# Patient Record
Sex: Female | Born: 1935 | Race: White | Hispanic: No
Health system: Southern US, Community
[De-identification: ages and names within clinical notes are randomized; demographics above are authoritative.]

---

## 2005-10-02 ENCOUNTER — Ambulatory Visit: Payer: Self-pay | Admitting: Family Medicine

## 2005-10-15 ENCOUNTER — Ambulatory Visit: Payer: Self-pay | Admitting: Family Medicine

## 2005-10-30 ENCOUNTER — Ambulatory Visit: Payer: Self-pay | Admitting: Surgery

## 2005-11-08 ENCOUNTER — Ambulatory Visit: Payer: Self-pay | Admitting: Surgery

## 2006-04-24 ENCOUNTER — Ambulatory Visit: Payer: Self-pay | Admitting: Surgery

## 2006-07-03 ENCOUNTER — Ambulatory Visit: Payer: Self-pay | Admitting: Family Medicine

## 2006-09-10 ENCOUNTER — Ambulatory Visit: Payer: Self-pay | Admitting: Family Medicine

## 2008-04-15 ENCOUNTER — Ambulatory Visit: Payer: Self-pay | Admitting: Family Medicine

## 2008-05-13 ENCOUNTER — Ambulatory Visit: Payer: Self-pay | Admitting: Family Medicine

## 2008-10-11 ENCOUNTER — Ambulatory Visit: Payer: Self-pay | Admitting: Family Medicine

## 2008-10-13 ENCOUNTER — Emergency Department: Payer: Self-pay | Admitting: Emergency Medicine

## 2009-02-13 ENCOUNTER — Emergency Department: Payer: Self-pay | Admitting: Internal Medicine

## 2009-04-15 ENCOUNTER — Ambulatory Visit: Payer: Self-pay | Admitting: Family Medicine

## 2009-12-21 ENCOUNTER — Ambulatory Visit: Payer: Self-pay | Admitting: Family Medicine

## 2013-09-24 ENCOUNTER — Emergency Department: Payer: Self-pay | Admitting: Emergency Medicine

## 2014-08-23 ENCOUNTER — Inpatient Hospital Stay: Payer: Self-pay | Admitting: Internal Medicine

## 2014-10-25 LAB — SURGICAL PATHOLOGY

## 2014-10-31 NOTE — Consult Note (Signed)
PATIENT NAME:  Michele Decker, WOODSIDE MR#:  [id number] DATE OF BIRTH:  [birth year]  DATE OF CONSULTATION:  08/23/2014  REFERRING PHYSICIAN:   CONSULTING PHYSICIAN:  Maryagnes Amos, MD  HISTORY OF PRESENT ILLNESS:  I have been asked by Dr. Hilton Sinclair to evaluate this pleasant and unfortunate woman for right hip and right wrist injuries. She is a 81 year old female with a history of dementia who lives in a nursing home. She apparently is quite ambulatory and enjoys walking. She was in her usual state of health until earlier this morning when she apparently lost her balance and fell onto her right side, injuring her right hip and her right wrist. She apparently did not strike her head and there is no documentation of any loss of consciousness. No other injuries are noted. She was brought to the Emergency Room where x-rays demonstrated a varus fracture of the right femoral neck. X-rays also demonstrated an essentially nondisplaced fracture of the right distal radius. She was placed into a sugar tong splint for her right wrist fracture by the Emergency Room staff.   PAST MEDICAL HISTORY:  As noted above. She also has a history of hypertension and hypothyroidism. She is status post a right total knee arthroplasty as well as a surgical procedure on her lumbar spine.   PHYSICAL EXAMINATION:  GENERAL:  We have an elderly female in some distress. She is responsive but confused. She is attended by her daughter and son, as well as by her daughter's husband. They have provided all of the history. VITAL SIGNS:  Stable and she is afebrile.  ORTHOPEDIC EXAMINATION: Limited to the right upper extremity and hand. The right forearm is in a splint. She is able to flex and extend all digits actively and has intact sensation to light touch to all distributions as evidenced by some withdrawal to the touch. She has good capillary refill to all digits.   Examination of her right hip and lower extremity demonstrates the skin around the  hip to be intact. There is no swelling, ecchymosis, erythema, or abrasions or lacerations. Her right lower extremity is somewhat shortened and externally rotated as compared to the left. She has pain with any attempted motion, either actively or passively, of the right hip. She again can dorsiflex and plantarflex her toes and ankle. Sensation appears to be intact to her right foot, again as evidenced by some withdrawal activity. She has good capillary refill to her foot.   X-RAYS:  X-rays of the pelvis and right hip are available for review, as are x-rays of the right wrist. The findings on these films are as described above.   IMPRESSION:  1.  Varus angulated right femoral neck fracture.  2.  Minimally impacted right distal radius fracture.   PLAN: The treatment options for each problem were discussed with the patient's family. Regarding her wrist, no additional treatment needs to be performed at this time. Based on the amount of displacement, I felt that this wrist fracture can be managed nonsurgically. She will remain in her splint at this time. Once swelling goes down, we will apply a regular short arm cast.   Regarding her right hip injury. I feel that she would be best managed by a hip hemiarthroplasty. This procedure has been discussed in detail with the patient's family, as have the potential risks (including bleeding, infection, nerve and/or blood vessel injury, persistent or recurrent pain, loosening of and/or failure of the components, dislocation, leg length inequality, need for further surgery, blood  clots, strokes, heart attacks and/or arrhythmias, etc.) and benefits. The patient's family state their understanding and wish to proceed.    I thank you for asking me to participate in the care of this most unfortunate woman. I will be happy to follow her with you.     ____________________________ J. Derald MacleodJeffrey Poggi, MD jjp:bu D: 08/23/2014 17:17:33 ET T: 08/23/2014 17:43:46  ET JOB#: [id number]  cc: Maryagnes AmosJ. Jeffrey Poggi, MD, <Dictator> Maryagnes AmosJ. JEFFREY POGGI MD ELECTRONICALLY SIGNED 08/24/2014 10:51

## 2014-10-31 NOTE — Op Note (Signed)
PATIENT NAME:  Michele Decker, Michele Decker MR#:  [id number] DATE OF BIRTH:  1933/10/05  DATE OF PROCEDURE:  08/23/2014  PREOPERATIVE DIAGNOSIS: Displaced right femoral neck fracture.   POSTOPERATIVE DIAGNOSIS: Displaced right femoral neck fracture with right greater trochanteric fracture, right hip.   PROCEDURES:  1. Right hip hemiarthroplasty using a cemented Biomet Echo fracture stem (#13) with a 49 mm outer diameter unipolar head and a -3 mm neck adapter. 2. Open reduction and internal fixation of a right greater trochanteric fracture using two 1.8 mm Dall-Miles cables.   SURGEON: Maryagnes Amos, M.D.   ANESTHESIA: Spinal.   FINDINGS: As noted above.   COMPLICATIONS: None.   ESTIMATED BLOOD LOSS: 700 mL.   TOTAL FLUIDS: 1400 mL of crystalloid.   URINE OUTPUT: 145 mL.   TOURNIQUET: None.   DRAINS: None.   CLOSURE: Michele Decker.   BRIEF CLINICAL NOTE: The patient is a 81 year old demented female who lives in a nursing home. Apparently, she fell while walking this morning, landing on her right side. She complained of right hip pain. She was brought to the Emergency Room where x-rays demonstrated a varus angulated right femoral neck fracture. X-rays also demonstrated an essentially nondisplaced right distal radius fracture. She was admitted to the medicine service and has been cleared medically. She presents, at this time, for definitive management of her right hip injury.   PROCEDURE: The patient was brought into the operating room. After adequate spinal anesthesia was obtained, she was lain in the left lateral decubitus position and secured using the lateral hip positioner. The right hip and lower extremity were prepped with ChloraPrep solution before being draped sterilely. Preoperative antibiotics were administered.   A standard posterior approach to the hip was made through an approximately 5 to 6 inch incision. The incision was carried down through the subcutaneous tissues to expose the  gluteal fascia and proximal end of the iliotibial band. These structures were split the length of the incision. Upon splitting these structures, a fracture hematoma was identified. Close inspection demonstrated that there was an essentially nondisplaced fracture of the posterior half of the greater trochanter. This piece was somewhat stable due to the fascial attachments, so care was taken to try to maintain its integrity throughout the procedure. A posterior flap was elevated off the posterior aspect of the greater trochanter and femoral neck and swept posteriorly. This flap included the piriformis tendon, the short external rotators, and the posterior capsule. Hemostasis was achieved using electrocautery. The femoral neck fracture was readily identified. The hip was gently internally rotated in a dislocation maneuver, effectively separating the shaft from the head. The head was carefully removed using a hip skid and corkscrew. The femoral head was carefully taken to the back table, where it was measured and found to be optimally replicated by a 49 mm component. The 49 mm trial was then inserted and found to fit quite well, demonstrating a suction-type fit.   Attention was directed to the femoral side. The canal was reamed sequentially, beginning with the end-cutting reamer, then progressing from a 7 mm tapered reamer to a 15 mm tapered reamer, which provided excellent circumferential chatter. The canal was then broached beginning with a #9 broach, progressing to a #14 broach. This actually fit quite well. When an attempt was made to impact the #15 broach, it was felt to be too tight. Therefore, it was felt best to implant a #13 cemented stem. The #14 stem was repositioned and a trial reduction performed. The hip demonstrated  good stability with extension and external rotation, as well as with flexion at 90 degrees and internal rotation beyond 60 degrees. It also was stable to the position of sleep. This  reduction was performed using the standard offset neck and the -6 mm neck option. Therefore, the permanent #13 standard offset stem was selected.   The canal was prepared for cementing by using a bottle brush, then irrigating it thoroughly with sterile saline solution via the jet lavage system. The cement restrictor was placed distally. After care was taken to be sure that the stem would seat fully, the canal was packed with a Neo-Synephrine soaked vag-pack. Meanwhile, cement was mixed on the back table. When the cement was ready, the cement was injected into the femoral stem and pressurized. The stem was inserted with care taken to maintain the appropriate anteversion. The stem was then held in place until the cement hardened. A repeat trial reduction was performed using the -6 mm and -3 mm neck options. The -3 mm neck option demonstrated excellent stability, both to extension and external rotation, as well as with flexion to 90 degrees and internal rotation beyond 70 degrees. There was no undue tension anteriorly. Therefore, the permanent 49 mm outer diameter shell with the -3 mm neck adapter was put together on the back table, then inserted. The Morse taper locking mechanism was verified using manual distraction and found to be excellent. The hip was relocated and again placed through a range of motion with the findings as described above.   The greater trochanteric fracture was then addressed. Given that the fracture was primarily vertically oriented and only involved the posterior half of the greater trochanter, it was felt best to proceed with stabilizing it using 2 Dall-Miles cables. Each of the 1.8 mm cables was passed circumferentially, anteriorly to posteriorly, and cinched securely with the tensioning system. Care was taken not to over tension and therefore, crack through the soft bone. An excellent reduction and good stability was achieved.  The wound was copiously irrigated with sterile saline  solution using the jet lavage system before the posterior flap was reapproximated to the posterior aspect of the greater trochanter through bone tunnels using #2 FiberWire. The iliotibial band was reapproximated using #0 Vicryl interrupted sutures before the gluteal fascia was closed using running #0 Vicryl suture. The subcutaneous tissues were closed in three layers using 2-0 Vicryl interrupted sutures before the skin was closed using Michele Decker. Of note, 20 mL of Exparel diluted out to 60 mL with normal saline was injected into the periarticular and peri-incisional tissues to help with postoperative analgesia. This was injected prior to closure of the iliotibial band. After closure of the iliotibial band, a total of 1 gram of tranexamic acid and 10 mL of normal saline was injected interarticularly to help with postoperative bleeding. After closure of the skin with Michele Decker, a sterile occlusive dressing was applied to the wound before the patient was placed into an abduction wedge pillow. She was then rolled back in the supine position on her hospital bed before she was awakened, extubated, and returned to the recovery room in satisfactory condition after tolerating the procedure well.    ____________________________ J. Derald MacleodJeffrey Poggi, MD jjp:JT D: 08/23/2014 20:39:33 ET T: 08/24/2014 08:50:42 ET JOB#: [id number]  cc: Maryagnes AmosJ. Jeffrey Poggi, MD, <Dictator>  Maryagnes AmosJ. JEFFREY POGGI MD ELECTRONICALLY SIGNED 08/24/2014 10:53

## 2014-10-31 NOTE — Discharge Summary (Signed)
PATIENT NAME:  Michele Decker, Michele Decker MR#:  Decker DATE OF BIRTH:  [birth year]  DATE OF ADMISSION:  08/23/2014 DATE OF DISCHARGE:  08/26/2014  PRESENTING COMPLAINT: Fall and hip pain.   DISCHARGE DIAGNOSES:  1.  Right femur fracture status post right hip hemiarthroplasty. Postoperative day 3 today.  2.  Atrial fibrillation, stable.  3.  Hypothyroidism.  4.  Hypertension.  5.  Dementia.  6.  Right distal radial wrist fracture, nondisplaced.  CODE STATUS: No code, DNR.   DIET: Regular.   FOLLOWUP: 1.  Follow up with Dr. Joice LoftsPoggi in 2 weeks.  2.  Follow up with Dr. Elizabeth Sauereanna Jones in 2 to 3 weeks.  CONSULTATIONS: Orthopedic consultation with Dr. Joice LoftsPoggi.  SURGERY PROCEDURES: Right hip hemiarthroplasty with open reduction and internal fixation of trochanteric fracture.   LABORATORY DATA: At discharge: H and H of 7.8 and 24.0, white count is 8.1, platelet count is 99,000. Creatinine is 1.53. Glucose is 132, sodium is 146, potassium is 4.3. UA negative for UTI.  BRIEF SUMMARY OF HOSPITAL COURSE: [name] is a 81 year old Caucasian female with a history of dementia along with a history of hypertension who comes to the Emergency Room after she had a fall and was hurting the right hip. She was admitted with:  1.  Right hip fracture. The patient underwent a right hip hemiarthroplasty with ORIF of the intertrochanteric fracture by Dr. Joice LoftsPoggi. Postoperatively, the patient developed some hypotension, which resolved with IV fluids. Her narcotic pain medications were held. She was tolerating p.o. Tylenol. Blood pressure stabilized.  2.  Atrial fibrillation. Not sure if it is new. However, [name] heart rate was controlled on metoprolol.  3.  Hypothyroidism. Synthroid was continued.  4.  History of hypertension with relative hypotension. The [name] blood pressure medications were held. Her beta blockers have been resumed with holding parameters. I will discontinue her lisinopril and hydrochlorothiazide,  triamterene at present, which can be resumed by primary care physician as outpatient if blood pressure continues to spike.  5.  Advanced dementia.  6.  Right distal wrist fracture, nondisplaced. A cast has been applied. She will follow up with Dr. Joice LoftsPoggi as outpatient.  Overall hospital stay otherwise remained stable. The patient is a no code, DNR.   TIME SPENT: 40 minutes.    ____________________________ Wylie HailSona A. Allena KatzPatel, MD sap:ST D: 08/26/2014 12:05:06 ET T: 08/26/2014 12:46:37 ET JOB#: [id number]  cc: Rosangela Fehrenbach A. Allena KatzPatel, MD, <Dictator> Willow OraSONA A Aziel Morgan MD ELECTRONICALLY SIGNED 08/31/2014 17:37

## 2014-10-31 NOTE — H&P (Signed)
PATIENT NAME:  Michele Decker, LAFFERTY MR#:  [id number] DATE OF BIRTH:  Jun 06, 1934  DATE OF ADMISSION:  08/23/2014  PRIMARY CARE PHYSICIAN:  Jannett Celestine, PA-C at [facility].   CHIEF COMPLAINT: Sent in with hip pain.   HISTORY OF PRESENT ILLNESS: This is a 81 year old female with dementia. She is unable to give any pertinent history about what happened. Apparently she had some sort of fall, but cannot tell me what the details were surrounding the fall. She complains of pain in the right hip area and also when I went to shake her hand she did not give me her right hand to shake hands with, she outstretched her left hand. On further questioning she did have some pain in the right wrist also. In the ER she was found to have a nondisplaced right femoral neck fracture. Hospitalist services were contacted for further evaluation.   PAST MEDICAL HISTORY: Dementia, she does not recognize people, but she is ambulatory, hypertension, hypothyroidism.   PAST SURGICAL HISTORY: Right knee replacement and fusion of the spine.   ALLERGIES: No known drug allergies.   MEDICATIONS: Include aspirin 81 mg daily, gabapentin 100 mg twice a day and 200 mg at bedtime, Dyazide 25/37.5 one tablet daily, levothyroxine 75 mcg daily, lisinopril 5 mg daily, loperamide 2 mg 8 times a day as needed for diarrhea, lorazepam 0.5 mg 3 times a day and every 4 hours as needed, Tylenol 500 mg every 4 hours as needed for headache, metoprolol ER 50 mg daily, milk of magnesia 30 mL once a day at bedtime, nystatin topical powder under skin folds, Robitussin 10 mL every 6 hours as needed for cough, Zoloft 100 mg daily.     SOCIAL HISTORY: Currently at [facility] memory care. Quit smoking 25 years ago. No alcohol or drug use.      FAMILY HISTORY: Both parents died of old age.   REVIEW OF SYSTEMS: Unremarkable and unreliable with the patient with dementia, unable to give much history.   PHYSICAL EXAMINATION:  VITAL SIGNS: On presentation to  the ER include temperature of 97.5, pulse 70, respirations 15, blood pressure 141/103. Blood pressure is better when I saw her, it did come down to 107/85.  EYES: Conjunctivae and lids normal. Pupils are equal, round, and reactive to light. Extraocular muscles intact. No nystagmus.  EARS, NOSE, MOUTH, AND THROAT: Tympanic membranes, no erythema. Nasal mucosa, no erythema. Throat, no erythema, no exudate seen. Lips and gums, no lesions.  NECK: No JVD. No bruits. No lymphadenopathy. No thyromegaly. No thyroid nodules palpated.  RESPIRATORY:  Lungs clear to auscultation. No use of accessory muscles to breathe. No rhonchi, rales, or wheeze heard.  CARDIOVASCULAR: S1 and S2 irregularly irregular and 2 out of 6 systolic ejection murmur. Carotid upstroke 2 + bilaterally. No bruits. Dorsalis pedis pulses 2 + bilaterally. No edema of the lower extremity.  ABDOMEN: Soft, nontender. No organomegaly/splenomegaly. Normoactive bowel sounds. No masses felt.  LYMPHATIC: No lymph nodes in the neck.  MUSCULOSKELETAL: No clubbing, edema, cyanosis.  SKIN: No ulcers or lesions seen.  NEUROLOGICAL: Cranial nerves II through XII grossly intact. Deep tendon reflexes not tested with the hip fracture. The patient states that she had feeling in her right leg when I touched. EXTREMITIES. Right hip is shortened and externally rotated. Pain to palpation over the right wrist.  PSYCHIATRIC: The patient does talk, unreliable with answers of questions, unable to give much history.   LABORATORY AND RADIOLOGICAL DATA: EKG shows atrial fibrillation 64 beats  per minute. Glucose 99, BUN 36, creatinine 1.53, sodium 143, potassium 4.1, chloride 108, CO2 of 30, calcium 9.3. White blood cell count 9.4, H and H 13.3 and 41.6, platelet count of 167,000. Chest x-ray negative. Pelvis x-ray shows a fracture of the right femoral neck. Femur x-ray shows nondisplaced right femoral neck fracture.      ASSESSMENT AND PLAN:  1.  Right hip fracture,  patient with dementia, unable to tell me what happened. No contraindications to surgery at this time.  2.  Atrial fibrillation, unclear if new or old. No need for workup prior to surgery, the patient is rate controlled on metoprolol, likely continue aspirin after surgery.  3.  Hypothyroidism. Continue levothyroxine.  4.  Hypertension. Continue usual medications.  5.  Dementia. Currently at [facility] memory care.  6.  Pain in the right wrist. I will obtain an x-ray of the right wrist to rule out fracture.   Son at the bedside helpful with history.  The patient was made a Do Not Resuscitate.   TIME SPENT ON ADMISSION: AND COORDINATION OF CARE: 55 minutes.    ____________________________ Herschell Dimesichard J. Renae GlossWieting, MD rjw:bu D: 08/23/2014 14:52:27 ET T: 08/23/2014 15:11:04 ET JOB#: [id number]  cc: Herschell Dimesichard J. Renae GlossWieting, MD, <Dictator> Jannett CelestineKaren Caffey, PA-C at [facility] Edgerrin Correia Jeanella AntonJ Ebenezer Mccaskey MD ELECTRONICALLY SIGNED 08/24/2014 16:41

## 2014-10-31 NOTE — Discharge Summary (Signed)
PATIENT NAME:  Darien RamusBUNKER, Korine S MR#:  [id number] DATE OF BIRTH:  1933/11/30  DATE OF ADMISSION:  08/23/2014 DATE OF DISCHARGE:  08/26/2014  ADDENDUM   DISCHARGE MEDICATIONS:  1.  Nystatin powder apply to effected area t.i.d.  2.  Zoloft 100 mg daily.  3.  Lovenox 30 mg subcutaneous b.i.d. These instructions are per ortho physician. 4.  Tylenol 650 q. 4 p.r.n.  5.  Gabapentin 100 mg p.o. b.i.d.  6.  Dulcolax 10 mg rectal p.r.n. for constipation.  7.  Senokot-S 1 tablet b.i.d.  8.  Milk of Magnesia 30 mL b.i.d. p.r.n.  9.  Protonix 40 mg b.i.d.  10.  Mag-AL Plus XS 30 mL q. 6 p.r.n.  11.  Synthroid 0.75 mg p.o. daily.  12.  Lorazepam 0.5 mg t.i.d. p.r.n.  13.  Metoprolol 12.5 mg b.i.d.; hold if systolic blood pressure is less than 120.   ____________________________ Jearl KlinefelterSona A. Allena KatzPatel, MD sap:sb D: 08/26/2014 12:06:56 ET T: 08/26/2014 12:22:42 ET JOB#: [id number]  cc: Olson Lucarelli A. Allena KatzPatel, MD, <Dictator> Willow OraSONA A Berlinda Farve MD ELECTRONICALLY SIGNED 08/31/2014 17:37

## 2014-10-31 NOTE — Consult Note (Signed)
Brief Consult Note: Diagnosis: Right femoral neck fracture.   Patient was seen by consultant.   Consult note dictated.   Recommend to proceed with surgery or procedure.   Orders entered.   Discussed with Attending MD.   Comments: The patient will benefit from a right hip hemiarthroplasty when cleared by Medicine. this procedure has been discussed with the patient's family who are in agreement to proceed.  Tentatively, she is on the schedule for tonight.  Thank you!.  Electronic Signatures: Derald MacleodPoggi, Jeffrey (MD)  (Signed [phone]-Feb-16 14:37)  Authored: Brief Consult Note   Last Updated: 22-Feb-16 14:37 by Derald MacleodPoggi, Jeffrey (MD)

## 2015-07-03 DEATH — deceased

## 2015-07-15 IMAGING — CR DG WRIST COMPLETE 3+V*R*
1 series · 4 of 4 positions shown · non-contrast
Comparison: None.

CLINICAL DATA: Pain following fall

EXAM:
RIGHT WRIST - COMPLETE 3+ VIEW

[Series 1: pa · 0.17mm/px · 4 of 4 slices shown]
[im 1/4]
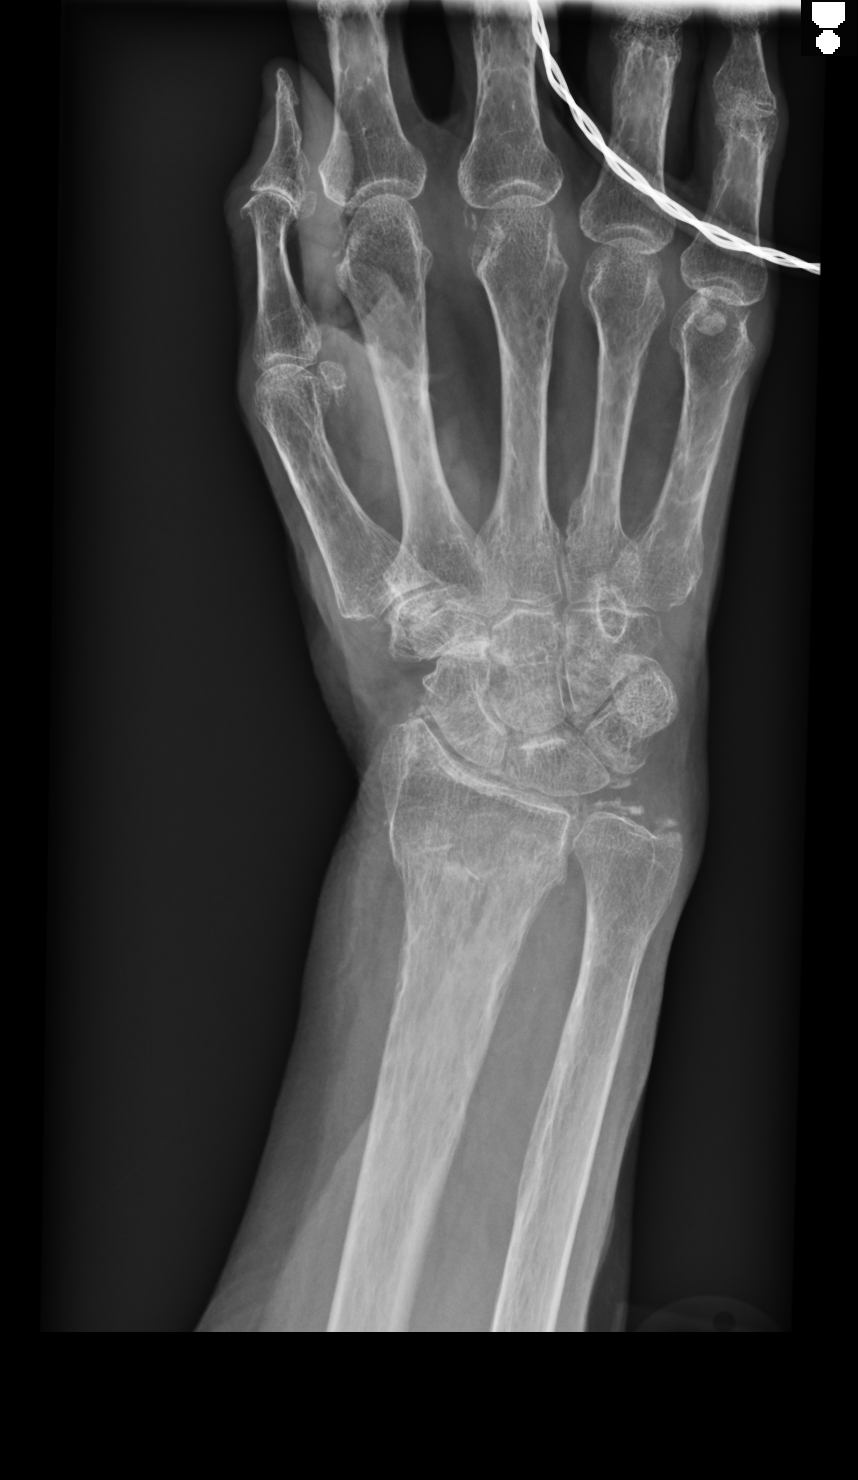
[im 2/4]
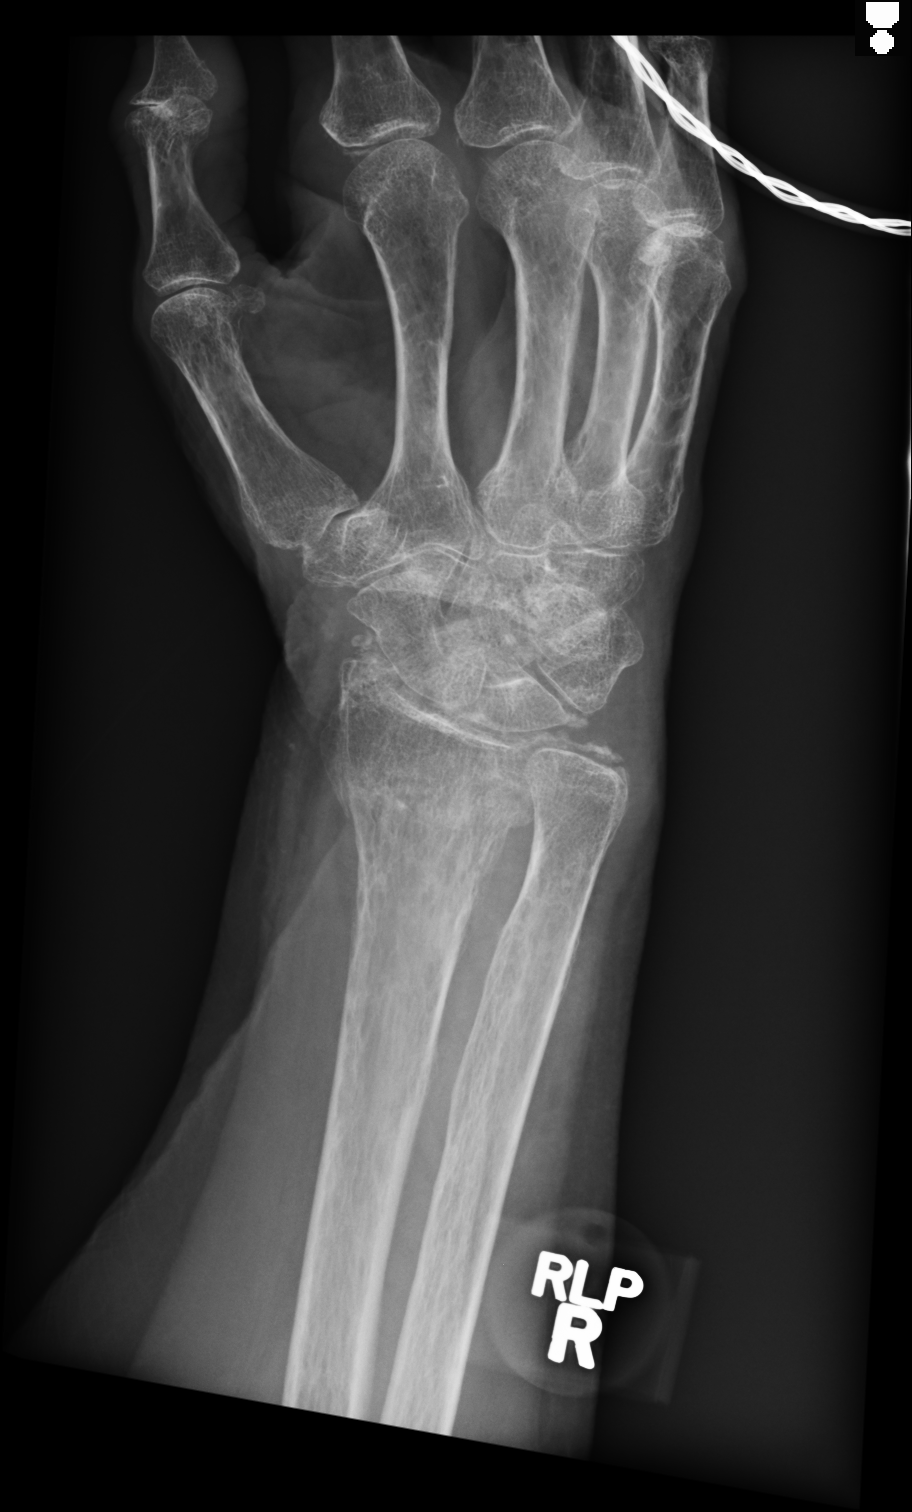
[im 3/4]
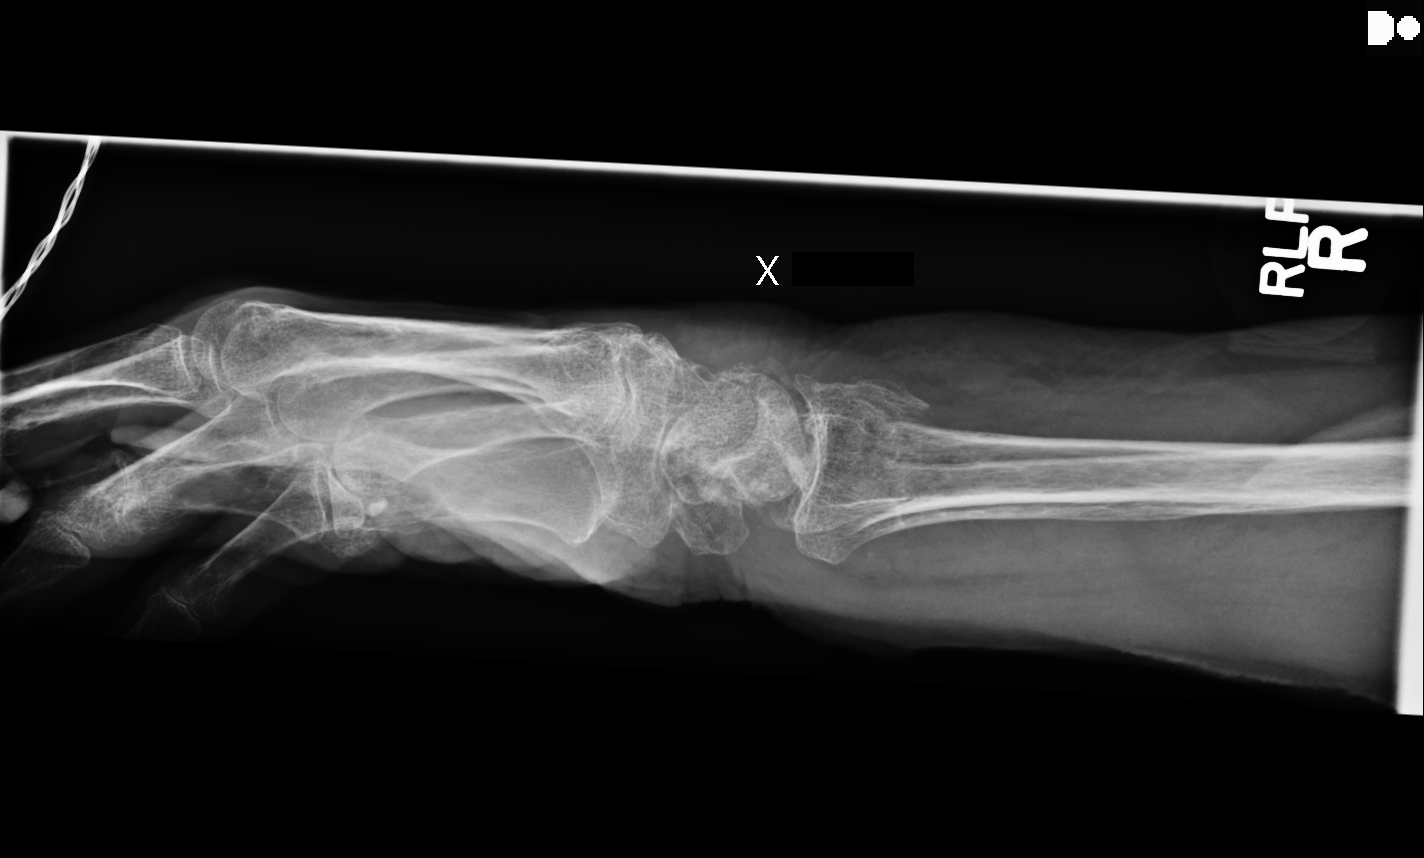
[im 4/4]
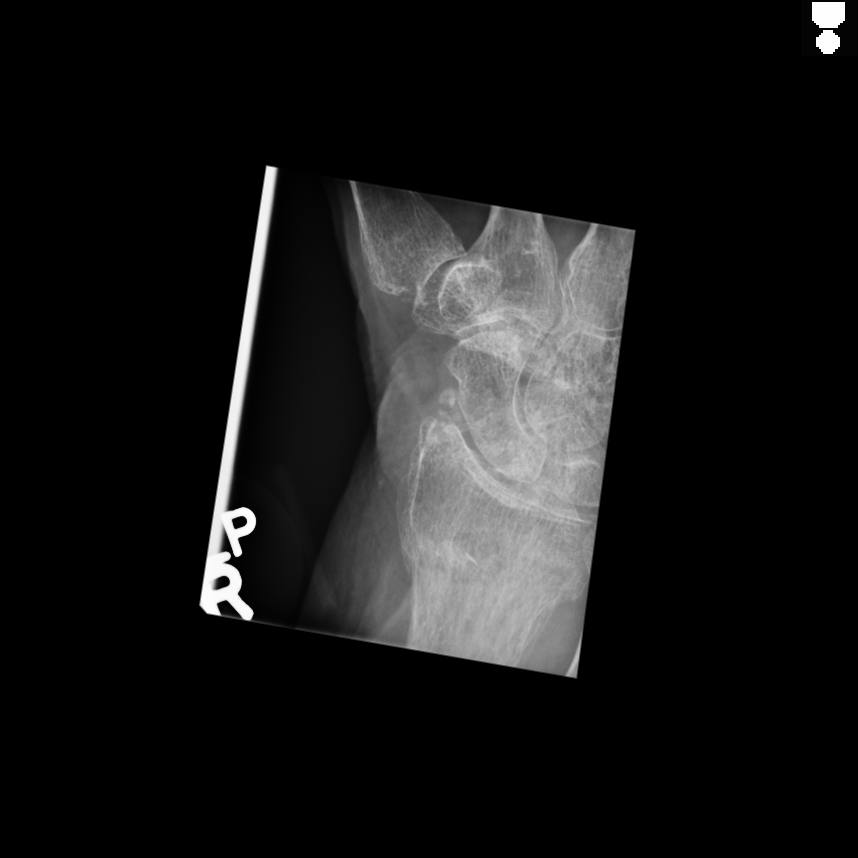

[4 of 4 positions shown; findings below may reference images not displayed]

FINDINGS: Frontal, oblique, lateral, and ulnar deviation scaphoid images were
obtained. Bones are osteoporotic. There is a fracture of the distal
radial metaphysis with mild dorsal displacement distally. There is
avulsion of the ulnar styloid. No dislocation. No other fractures
are identified. There is calcification in the triangular
fibrocartilage region. There is also a radiocarpal calcification.
There is narrowing of the radiocarpal joint. There is also
scaphotrapezial joint narrowing.
IMPRESSION: Fractures of the distal radial metaphysis and ulnar styloid. No
dislocation. Suspect chronic triangular fibrocartilage tear given
the degree of calcification in the triangular fibrocartilage region.
Areas of osteoarthritic change. Bones osteoporotic.

## 2015-07-15 IMAGING — CR DG HIP (WITH PELVIS) 1V*R*
1 series · 2 of 2 positions shown · non-contrast
Comparison: None.

CLINICAL DATA: Intraoperative imaging

EXAM:
RIGHT HIP (WITH PELVIS) 1 VIEW

[Series 1: ap · 0.17mm/px · 2 of 2 slices shown]
[im 1/2]
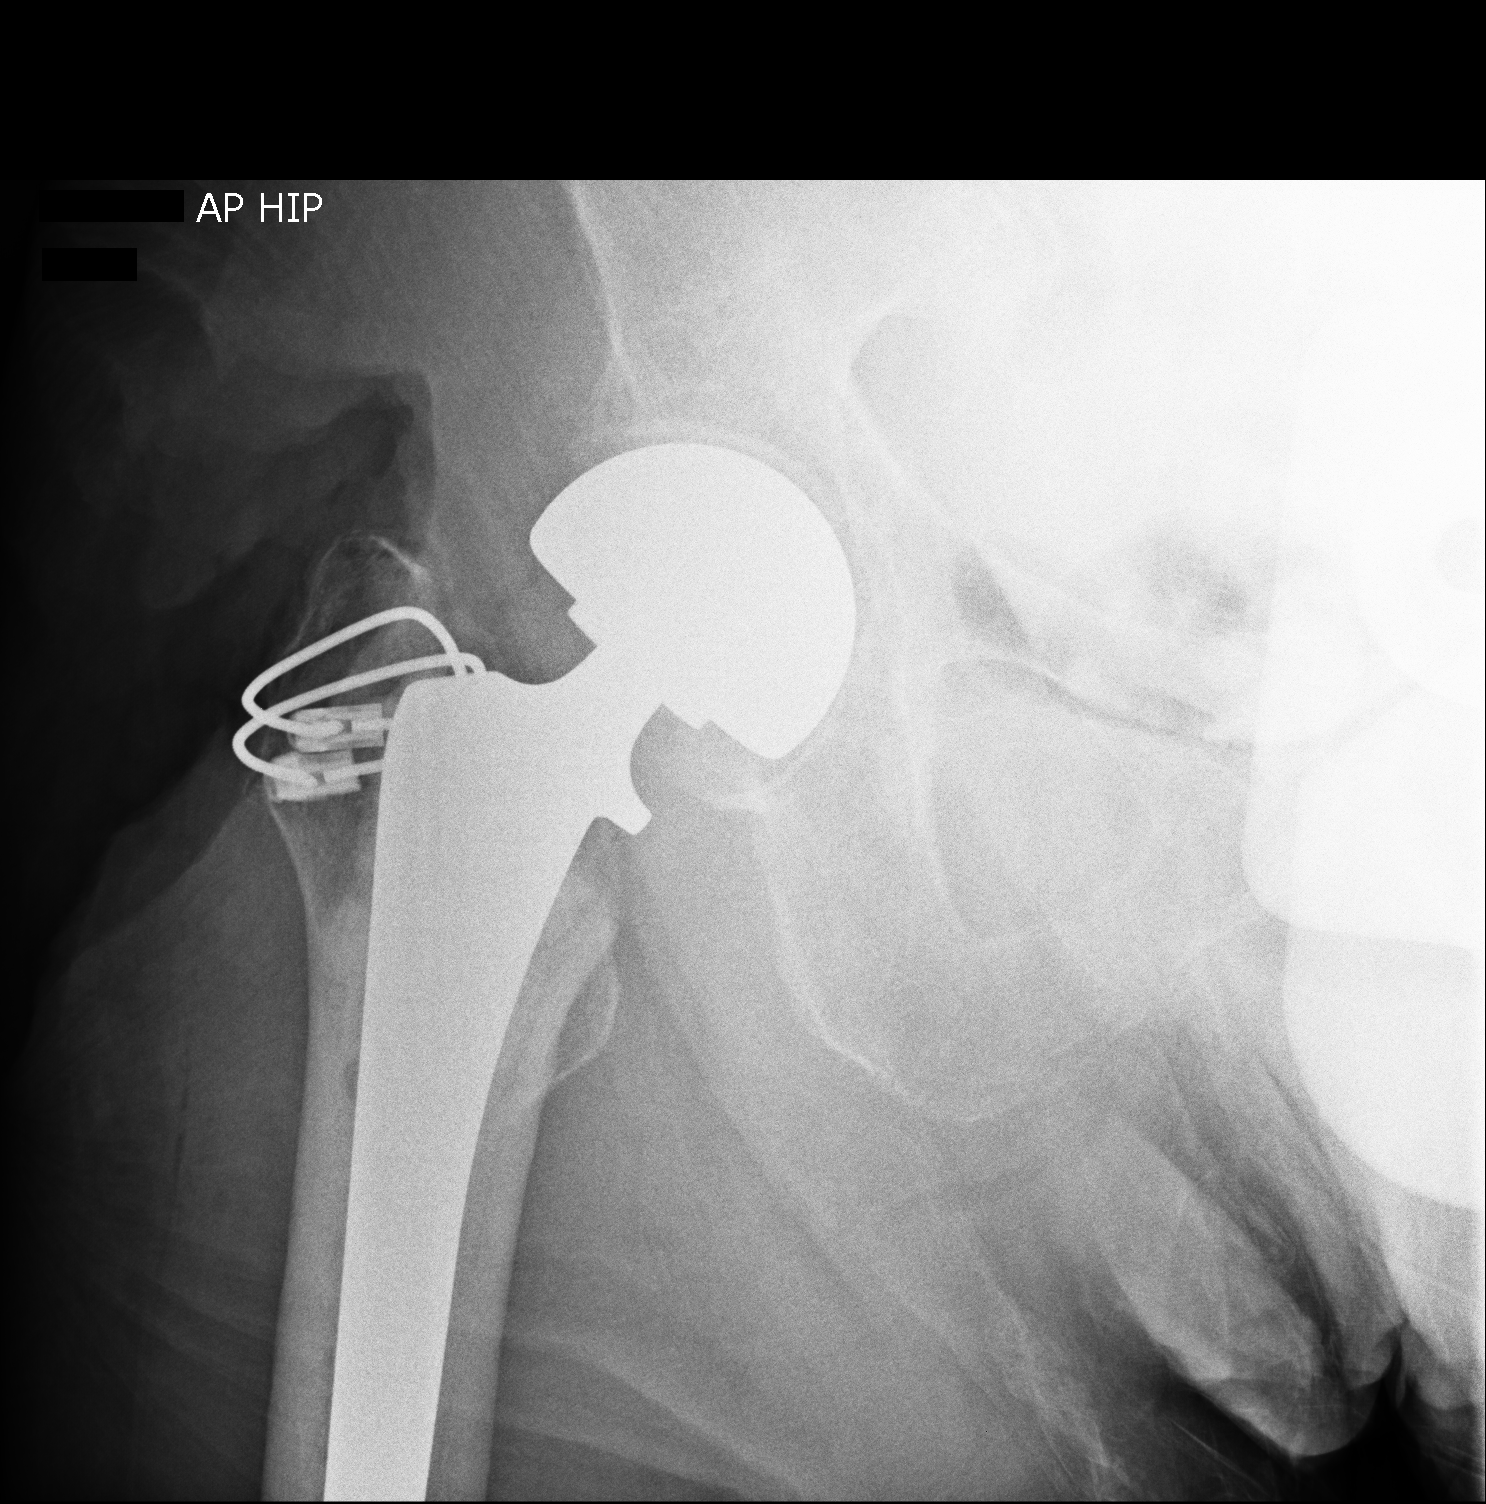
[im 2/2]
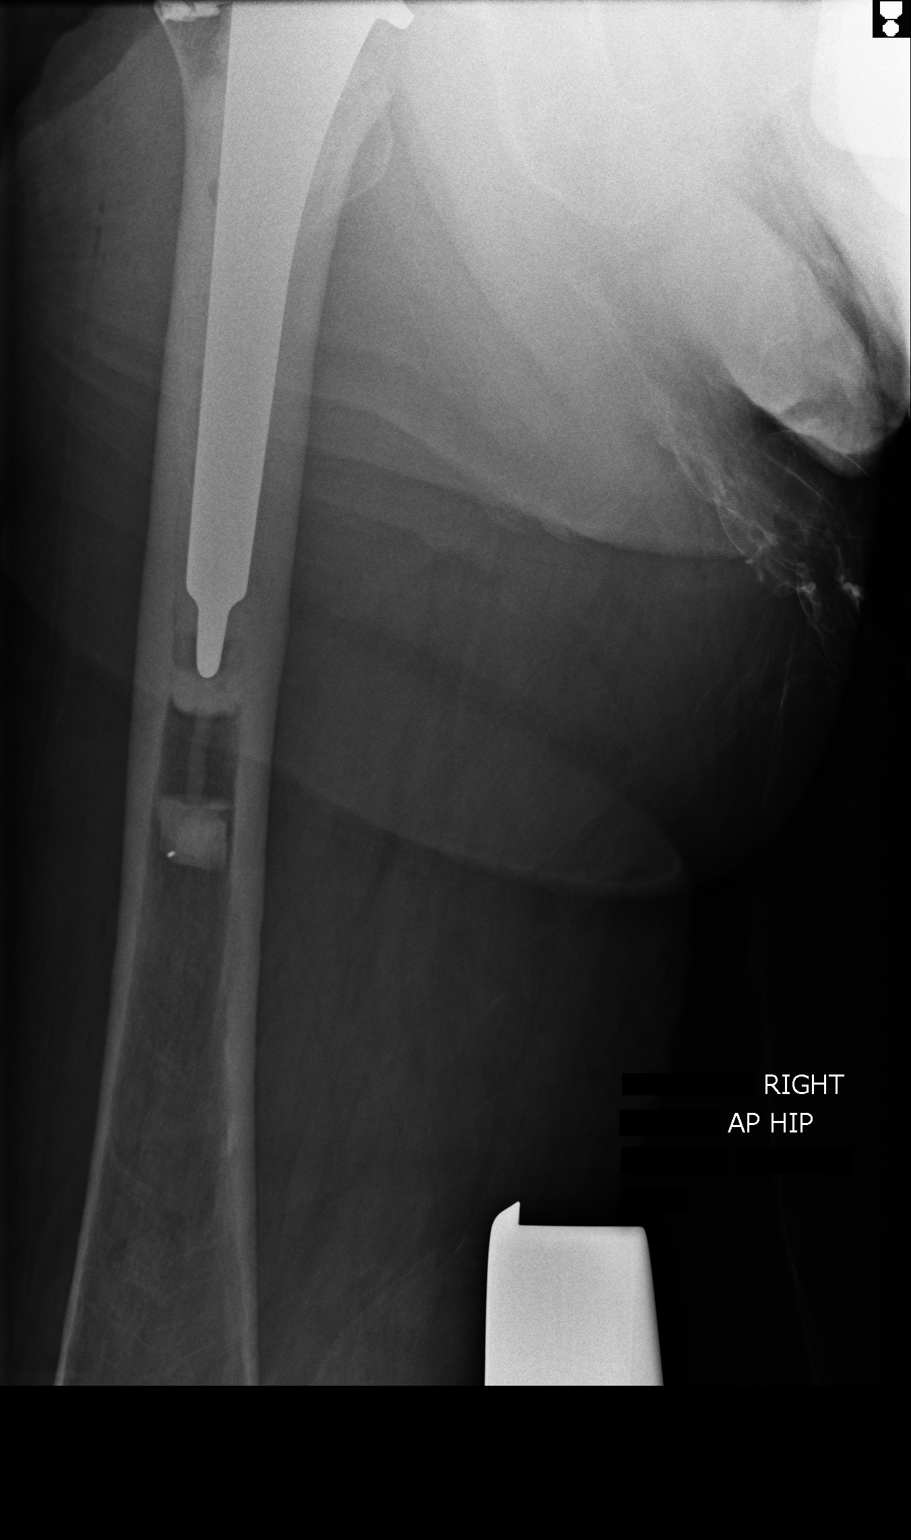

[2 of 2 positions shown; findings below may reference images not displayed]

FINDINGS: Right hip hemiarthroplasty is in place. Anatomic alignment. No
breakage or loosening of the hardware.
IMPRESSION: Right hip hemiarthroplasty anatomically aligned.

## 2015-07-15 IMAGING — CR PELVIS - 1-2 VIEW
1 series · 1 of 1 positions shown · non-contrast
Comparison: None.

CLINICAL DATA: Pain following fall earlier today

EXAM:
PELVIS - 1-2 VIEW

[dxr pelvis ap only]
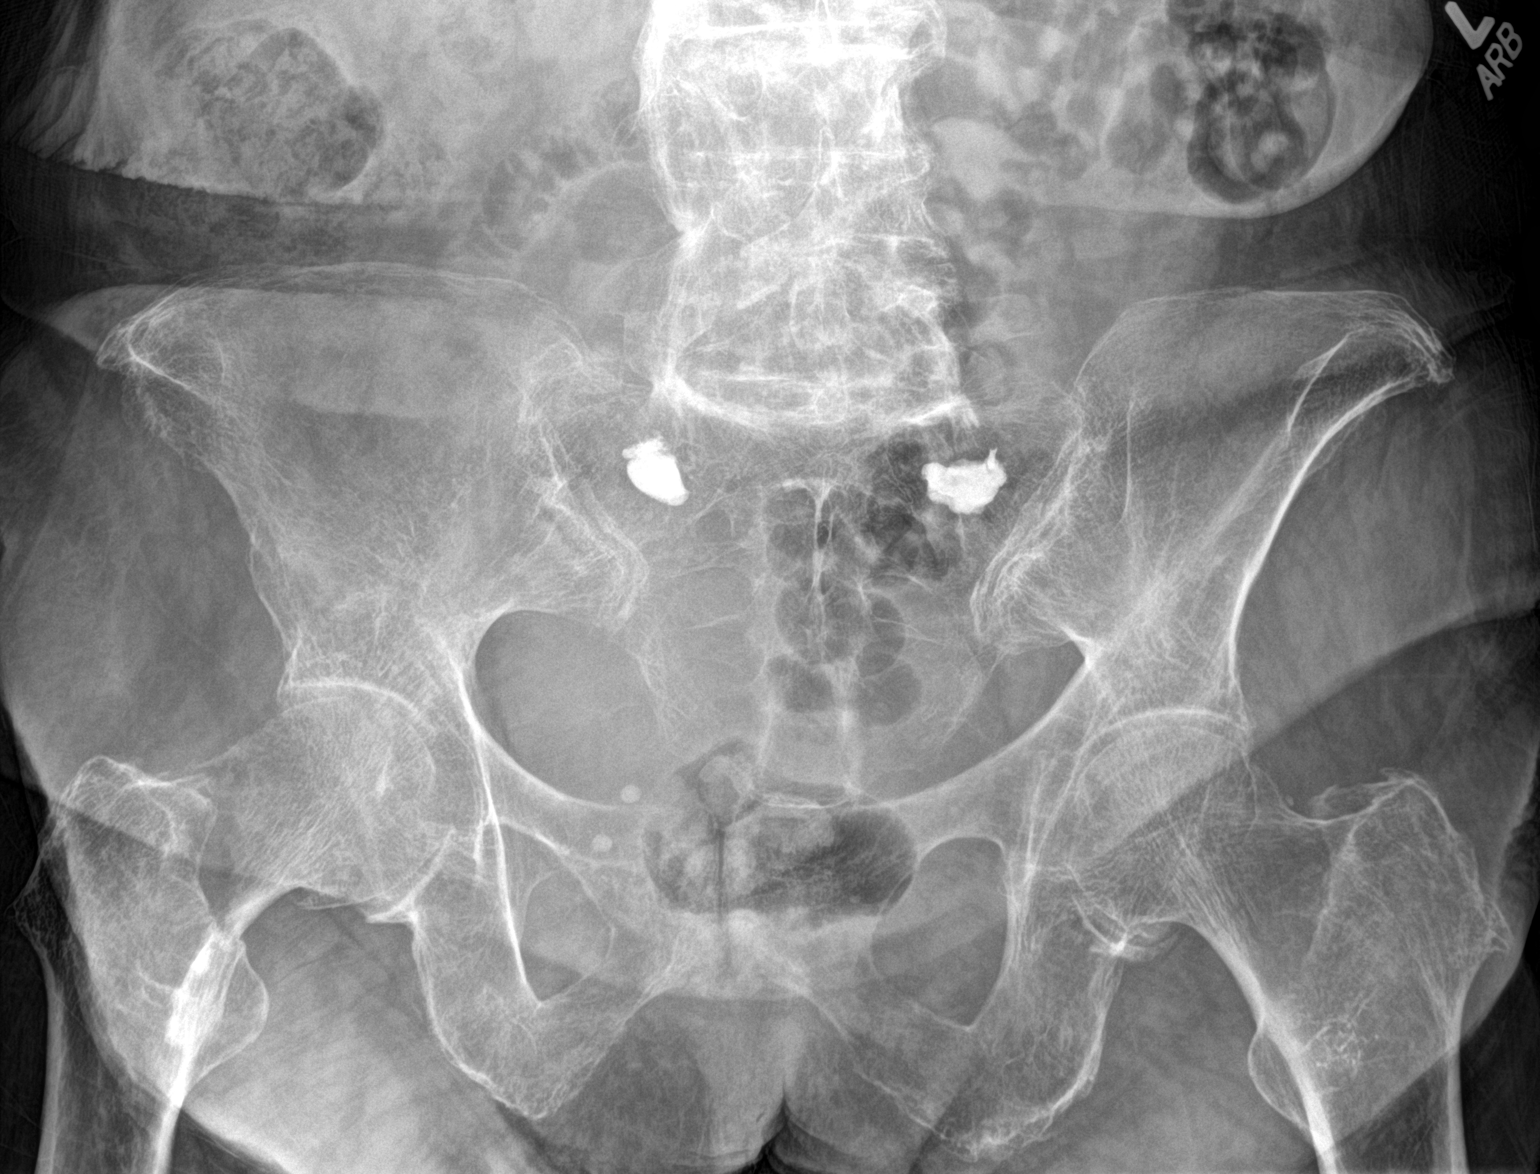

[1 of 1 positions shown; findings below may reference images not displayed]

FINDINGS: There is a fracture through the right femoral neck in essentially
anatomic alignment. No other fracture. No dislocation. Bones are
osteoporotic. There is moderate narrowing of both hip joints. There
are opacities in each sacral ale a. Question previous vertebroplasty
procedure in these areas.
IMPRESSION: Fracture right femoral neck in near anatomic alignment. Bones
diffusely osteoporotic. Moderate narrowing both hip joints.

## 2020-06-08 NOTE — Telephone Encounter (Signed)
This encounter was created in error - please disregard.
